# Patient Record
Sex: Male | Born: 1950 | Race: Black or African American | Hispanic: No | Marital: Single | Smoking: Never smoker
Health system: Southern US, Community
[De-identification: ages and names within clinical notes are randomized; demographics above are authoritative.]

## PROBLEM LIST (undated history)

## (undated) DIAGNOSIS — E119 Type 2 diabetes mellitus without complications: Secondary | ICD-10-CM

## (undated) DIAGNOSIS — E7800 Pure hypercholesterolemia, unspecified: Secondary | ICD-10-CM

## (undated) DIAGNOSIS — I10 Essential (primary) hypertension: Secondary | ICD-10-CM

## (undated) HISTORY — PX: PROSTATECTOMY: SHX69

---

## 2004-01-03 ENCOUNTER — Inpatient Hospital Stay (HOSPITAL_COMMUNITY): Admission: RE | Admit: 2004-01-03 | Discharge: 2004-01-06 | Payer: Self-pay | Admitting: Urology

## 2004-01-03 ENCOUNTER — Encounter (INDEPENDENT_AMBULATORY_CARE_PROVIDER_SITE_OTHER): Payer: Self-pay | Admitting: Specialist

## 2007-12-13 ENCOUNTER — Emergency Department (HOSPITAL_COMMUNITY): Admission: EM | Admit: 2007-12-13 | Discharge: 2007-12-13 | Payer: Self-pay | Admitting: Emergency Medicine

## 2010-06-14 NOTE — Op Note (Signed)
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   PROCEDURE:  Radical retropubic prostatectomy, bilateral pelvic lymph node  dissection.   ANESTHESIA:  General.   SURGEON:  Courtney Paris, M.D.   ASSISTANTVonita Moss.   BRIEF HISTORY:  This 60 year old black male is admitted with a clinical T1B  Gleason 3+3 adenocarcinoma of the prostate for radical surgery.  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  After mechanical bowel prep, he enters now  for radical surgery.   With the patient in the supine position with a rolled towel under his  sacrum, the patient was placed in a slightly flexed position.  The  suprapubic area was shaved with electric shavers and prepped and draped with  Betadine in the usual sterile fashion.  Gentamicin and Ancef was given  preoperatively.  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  The bladder was then drained.  A midline  incision was made and carried down through the subcutaneous tissue to expose  the midline fascia, and the rectus was opened in the midline.  The  retroperitoneal space was then opened.  His muscles were quite tense, and it  took quite a bit of relaxation to be able to expose the  pelvis with the  Bookwalter retractor.  First, on the patient's right side, the nodes in the  obturator fossa were removed by clipping and tying efferent lymphatics and  taking this packet from the external iliac vein down to an including the  obturator fossa back to the bifurcation of the iliac.  Care was taken to  preserve the obturator nerve and vessels.  The nodes were felt to be benign.  The endopelvic fascia was incised with the Bovie on this side.  On the  patient's left side, a similar packet was taken from the obturator fossa.  Again, at the inferior margin of the iliac vein down to the obturator nerve  back to the bifurcation of the iliac, this packet of nodes also felt benign.  The efferent lymphatics were either clipped or tied as they were  encountered.  The endopelvic fascia was then incised with the Bovie on this  side as well.  The puboprostatic ligaments were then cut under direct  vision, and a Hohenfellner clamp was then passed underneath the dorsal vein  complex with two ties with a #1 Vicryl and then  a back-tie with a 0 Vicryl  suture.  Another 0 Vicryl was placed on the bladder neck to prevent back-  bleeding, as the dorsal vein complex was then incised with the Bovie.  Using  the Stamey retractor, I could expose the urethra, which was well visible.  A  right angle clamp was passed underneath this and elevated with the umbilical  tape.  Under direct vision, the urethra was cut, leaving a nice stump under  which later sutures could be later passed.  The Foley catheter was then  brought through the anterior wall of the urethra as the posterior wall was  being cut as well as Denonvilliers fascia posteriorly.  This plane was  developed sharply and bluntly as the pedicle was then taken on either side  close to the prostate back to the level of the seminal vesicles.  This was  done either with sutures or clips.  There was a little bit of bleeding of  the neurovascular bundle  on the patient's left side, which had to be clipped  to effect hemostasis.  The midline over the seminal vesicles was then  incised, and the vas deferens were then delivered, clipped, and then  divided.  The ends of the vas were then dissected out, and a clamp with silk  suture was then passed around the tips and tied off as the seminal vesicles  were then incised.   Attention was then turned to the bladder neck, where the bladder neck was  dissected free with tonsil clamp.  The urethra was then dissected as it  coursed into the prostate and then cut under direct vision.  The Foley was  removed and used to elevate the prostate.  The prostate was then dissected  off the bladder both sharply and bluntly.  The prostate and seminal vesicles  were then removed.  Hemostasis was achieved by using a few 4-0 chromic cat  gut sutures to effect good hemostasis. The edges of the urethral mucosa on  the bladder neck was then everted with 4-0 chromic cat gut sutures  circumferentially with simple sutures.   Next, a 24 Greenwald sound was then passed per urethra, and five sutures  were placed on the urethral stump at 5, 9, 12, 3, and 5 o'clock with 2-0  Vicryl sutures.  The [place] sound was then removed, and a #20 Foley  catheter was then passed and then passed into the bladder and the balloon  inflated to 15 cc.  The corresponding urethral stitches were then placed on  the bladder neck in their anatomic positions, and when tied down, the  anastomosis seemed to be fairly water tight.  A few small clots were  evacuated from the bladder, but the irrigant was then clear.  The patient  then had a J-P drain brought out through a separate stab incision on the  patient's left side and sutured to the skin with a nylon.  The midline  fascia was closed with a running #1 PDS.  The skin was closed with clips.  Sterile dry dressings were applied.  The Foley was left to straight drainage.  Patient was taken to  the recovery room in good condition.  Sponge, needle, and instrument counts were correct on at least two  occasions.  Estimated blood loss was about 1100 cc, but no transfusions were  given, as he was stable throughout.  He was sent to the recovery room in  good condition, to later be admitted for postoperative care.  HMK/MEDQ  D:  01/03/2004  T:  01/03/2004  Job:  [id number]

## 2010-06-14 NOTE — Discharge Summary (Signed)
NAME:  Steven Barr, Steven Barr NO.:  [id number]   MEDICAL RECORD NO.:  [id number]          PATIENT TYPE:  INP   LOCATION:  0371                         FACILITY:  [facility]   PHYSICIAN:  Rozanna Boer., M.D.DATE OF BIRTH:  [birth year]   DATE OF ADMISSION:  01/03/2004  DATE OF DISCHARGE:  01/06/2004                                 DISCHARGE SUMMARY   DISCHARGE DIAGNOSES:  1.  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  2.  Elevated prostate specific antigen.  3.  Erectile dysfunction.  4.  Hypertension.   OPERATIONS/PROCEDURES:  Radical retropubic prostatectomy with bilateral  pelvic lymph node excision, January 03, 2004.   BRIEF HISTORY:  A 60 year old black male was admitted with a clinical IC  Gleason 3+3 adenocarcinoma of the prostate for radical retropubic  prostatectomy.  His PSA was 8.5 in October.  It was 3.1 in April, 2001.  Biopsy showed 5% on the left and 15% on the right of Gleason 3+3.  He does  have some impotence but decided to go ahead and do radical surgery, which  had the risks included but not limited to incontinence, impotence, deep  venous thrombosis, bleeding, pulmonary emboli, and death.  He auto-donated 2  units of blood.  His medicines include lisinopril 20 mg daily for blood  pressure.  The only previous operation was circumcision in 1995.  No  allergies.   After satisfactory preop evaluation, which included a hematocrit of 34% and  normal renal function tests with a creatinine of 1.0 and normal lytes, he  was taken up to the operating room, where he underwent a radical retropubic  prostatectomy and bilateral pelvic lymph node dissection on December 7th.  The patient had about 1100 cc of blood loss but postoperatively, his volume  was low and had to require fair amounts of fluids.  His hematocrit was  checked and was 7.8, so he was given 2 units of autologous blood in the  first two days postoperatively.  His pathology specimen  showed a PT2C, N0,  Gleason 3+4 adenocarcinoma.  The nodes were negative.  He had no capsular  extension, and the margins were negative for tumor.  Seminal vesicles and  nodes were negative.  His hematocrit remained stable at 28.5 after the blood  transfusions, and he progressed to a regular diet.  His drain was removed on  the third postop day, at which time he was discharged in satisfactory,  ambulatory condition on a regular diet.  He will come back to the office in  four days to have his skin staples removed, sent home in improved ambulatory  condition on a regular diet.      HMK/MEDQ  D:  01/17/2004  T:  01/17/2004  Job:  [id number]

## 2010-06-14 NOTE — H&P (Signed)
NAME:  Steven Barr, Steven Barr NO.:  [id number]   MEDICAL RECORD NO.:  [id number]          PATIENT TYPE:  INP   LOCATION:  NA                           FACILITY:  [facility]   PHYSICIAN:  Rozanna Boer., M.D.DATE OF BIRTH:  November 15, 1950   DATE OF ADMISSION:  DATE OF DISCHARGE:                                HISTORY & PHYSICAL   BRIEF HISTORY:  This 60 year old black male is admitted with a clinical T1B  Gleason 3 +3 adenocarcinoma of the prostate for radical retropubic  prostatectomy.  His PSA was 8.5 in October.  It was 3.1 in April 2001.  He  had 5% positive biopsies on the left and 15% on the right.  He does have  some impotence but decided to go ahead with surgery with risks including but  not limited to incontinence, impotence, deep vein thrombosis, pulmonary  emboli, bleeding, and death.  Daughter donated 2 units of blood, and  underwent a mechanical bowel prep yesterday.   PAST MEDICAL HISTORY:  1.  His medicines include Lisinopril 20 mg daily.  2.  No allergies.  3.  Only operation was a circumcision in 1996.   REVIEW OF SYSTEMS:  He is a nonsmoker.  No cardiac or pulmonary  symptomatology, no change in bowel habits, no melena, no arthritis,  breathing disorders, diabetes, or liver disease.   FAMILY HISTORY AND SOCIAL HISTORY:  He has been divorced from his second  wife about 6 years ago, has a 60 year old daughter from his first marriage.  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  His father died of a train accident at age  57.  His mother is 71, living and well.  He has two sisters and three  brothers living and well.  One sister died of an intestinal obstruction at  age 12, and one brother died of an MI at 44 years of age.  He has a brother  with diabetes and no family history of prostate cancer.   PHYSICAL EXAMINATION:  VITAL SIGNS:  His temperature is afebrile.  Blood  pressure is 149/90, pulse 56, respirations 18.  GENERAL:  He is a stocky, young black male in  no acute distress.  HEENT:  Clear.  Oropharynx is benign.  NECK:  Clear.  No inguinal, cervical, or axillary adenopathy.  CHEST:  Clear.  No murmurs.  ABDOMEN:  Soft.  Liver/spleen nonpalpable.  Bladder is nondistended.  GU:  Penis normal circumcised, adequate meatus, bilaterally descended  testes, epididymis, scrotum negative.  He has about a 35 g prostate, not  fixed or indurated.  Seminal vesicals not palpated.  EXTREMITIES:  Negative, no edema, good distal pulses.  Intact sensation to  light touch.   IMPRESSION:  1.  T1B Gleason 3 + 3 adenocarcinoma of the prostate.  2.  Elevated PSA.  3.  ED.  4.  Mild hypertension under control.   RECOMMEND:  Radical retropubic prostatectomy and bilateral pelvic lymph node  dissection as indicated.      HMK/MEDQ  D:  01/03/2004  T:  01/03/2004  Job:  [id number]   cc:   Bryan Lemma. Ehinger,  M.D.  [address] [place]  [place] [address]  Fax: [phone]

## 2010-08-02 IMAGING — CR DG KNEE COMPLETE 4+V*L*
4 series · 4 of 4 positions shown · non-contrast
Comparison: None

CLINICAL DATA: Left knee pain

LEFT KNEE - COMPLETE 4+ VIEW

[t knee ap left]
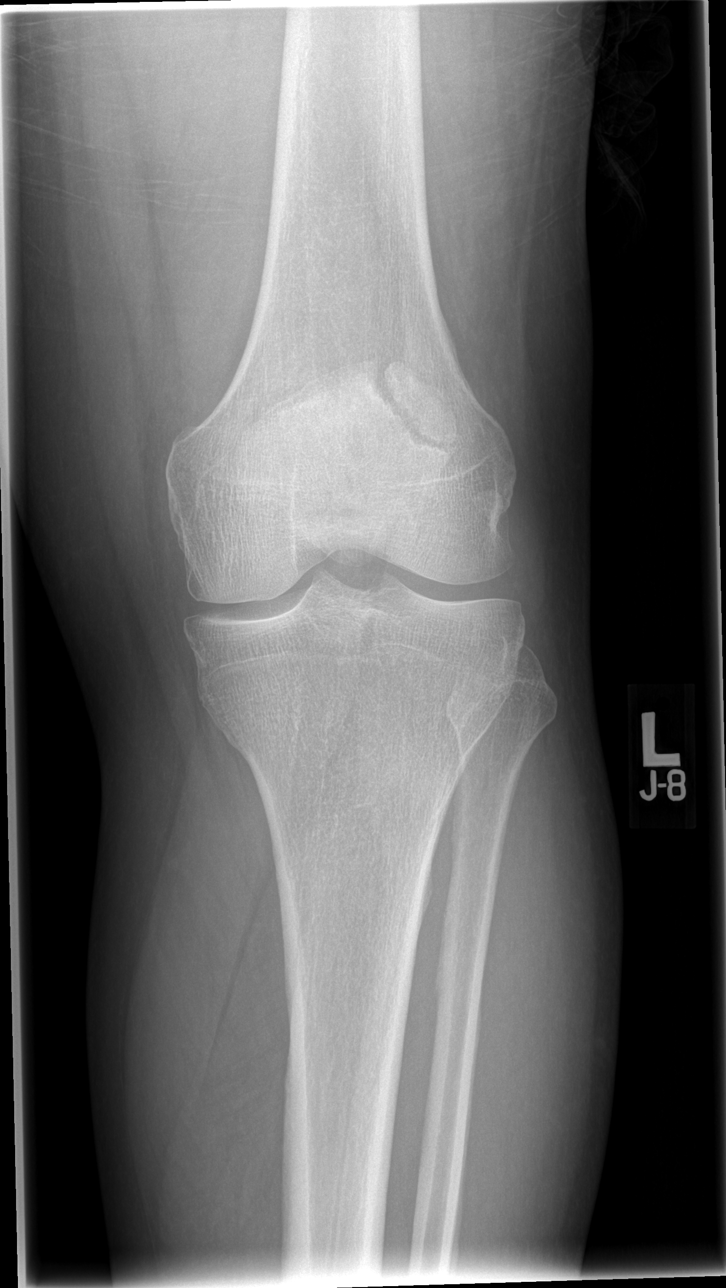

[t knee oblique left (1 of 2)]
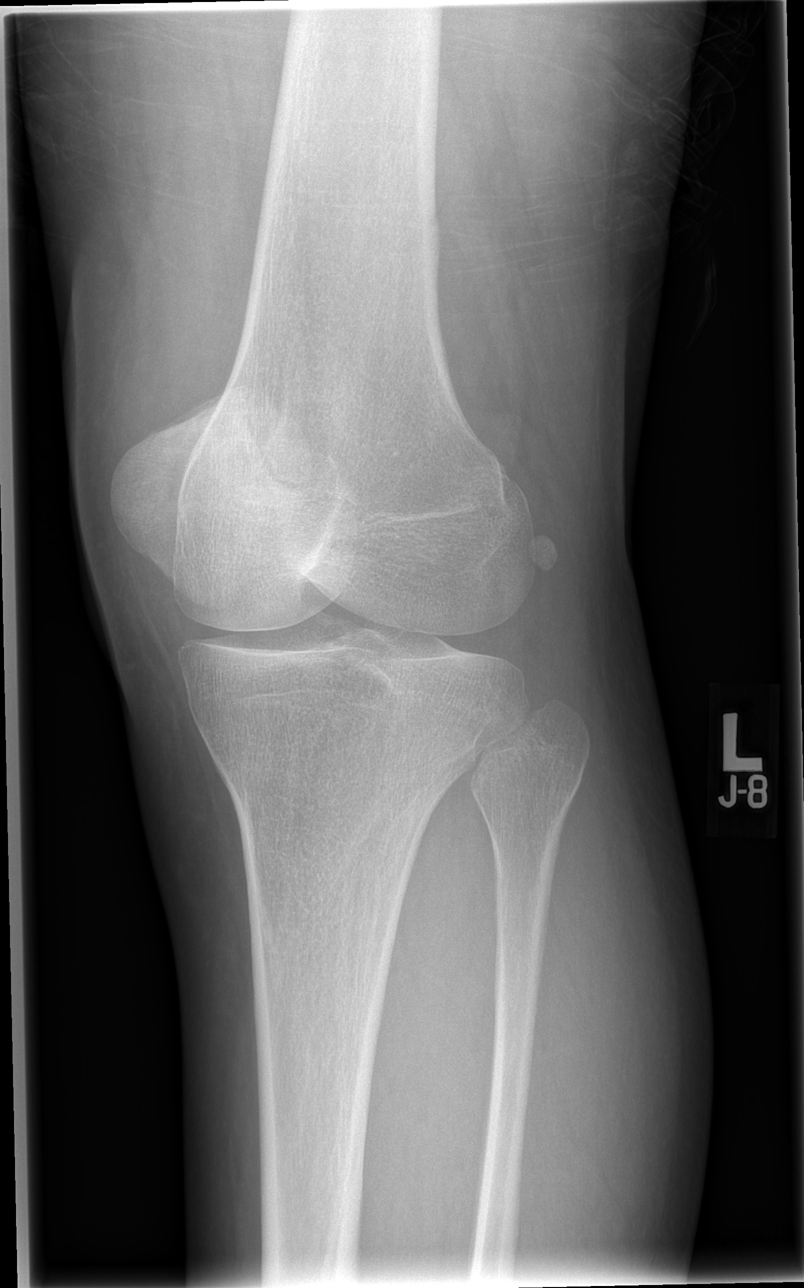

[t knee oblique left (2 of 2)]
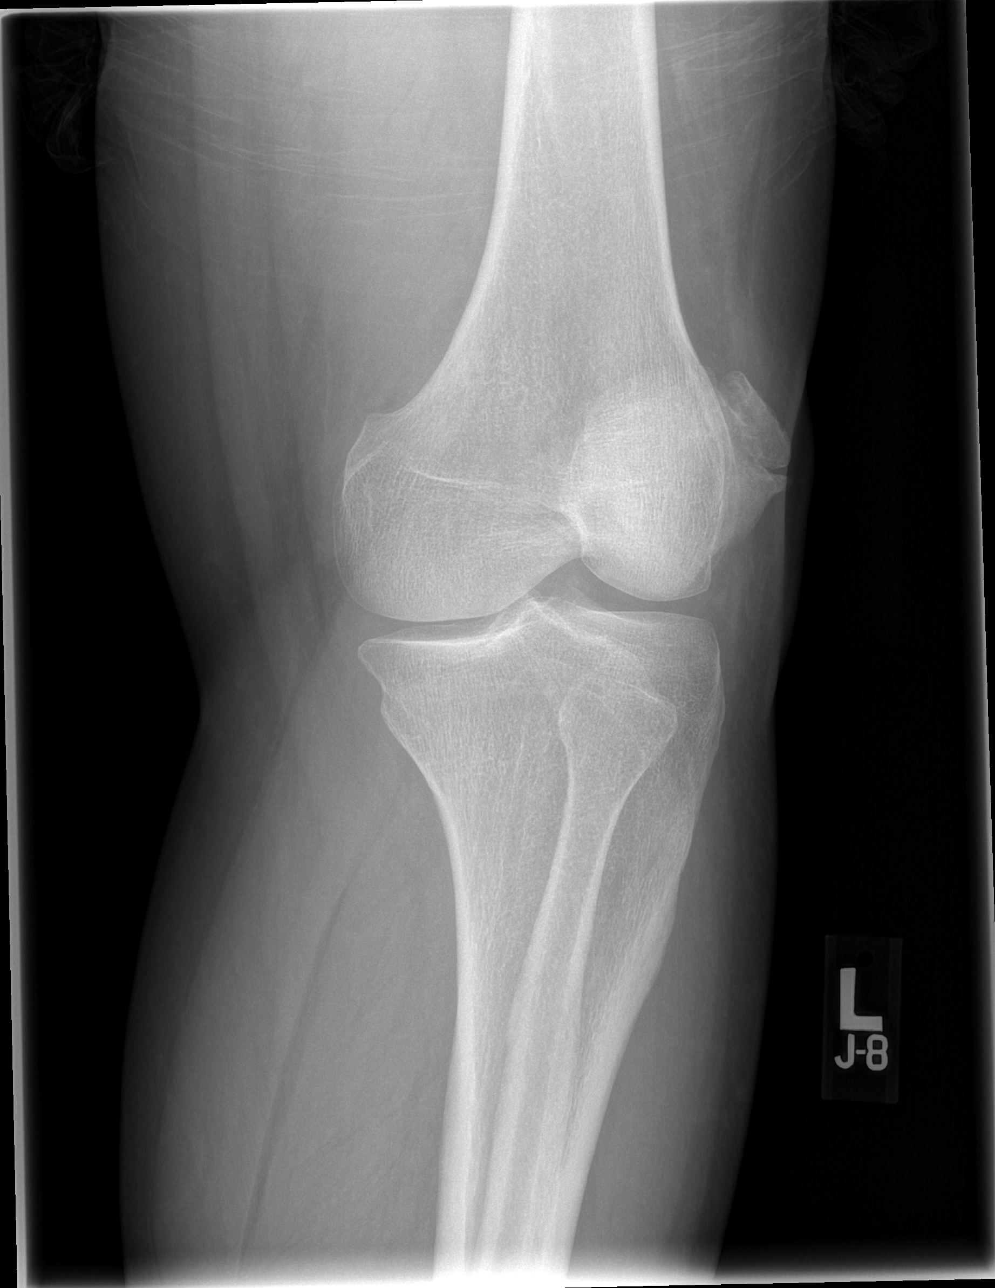

[t knee lat left]
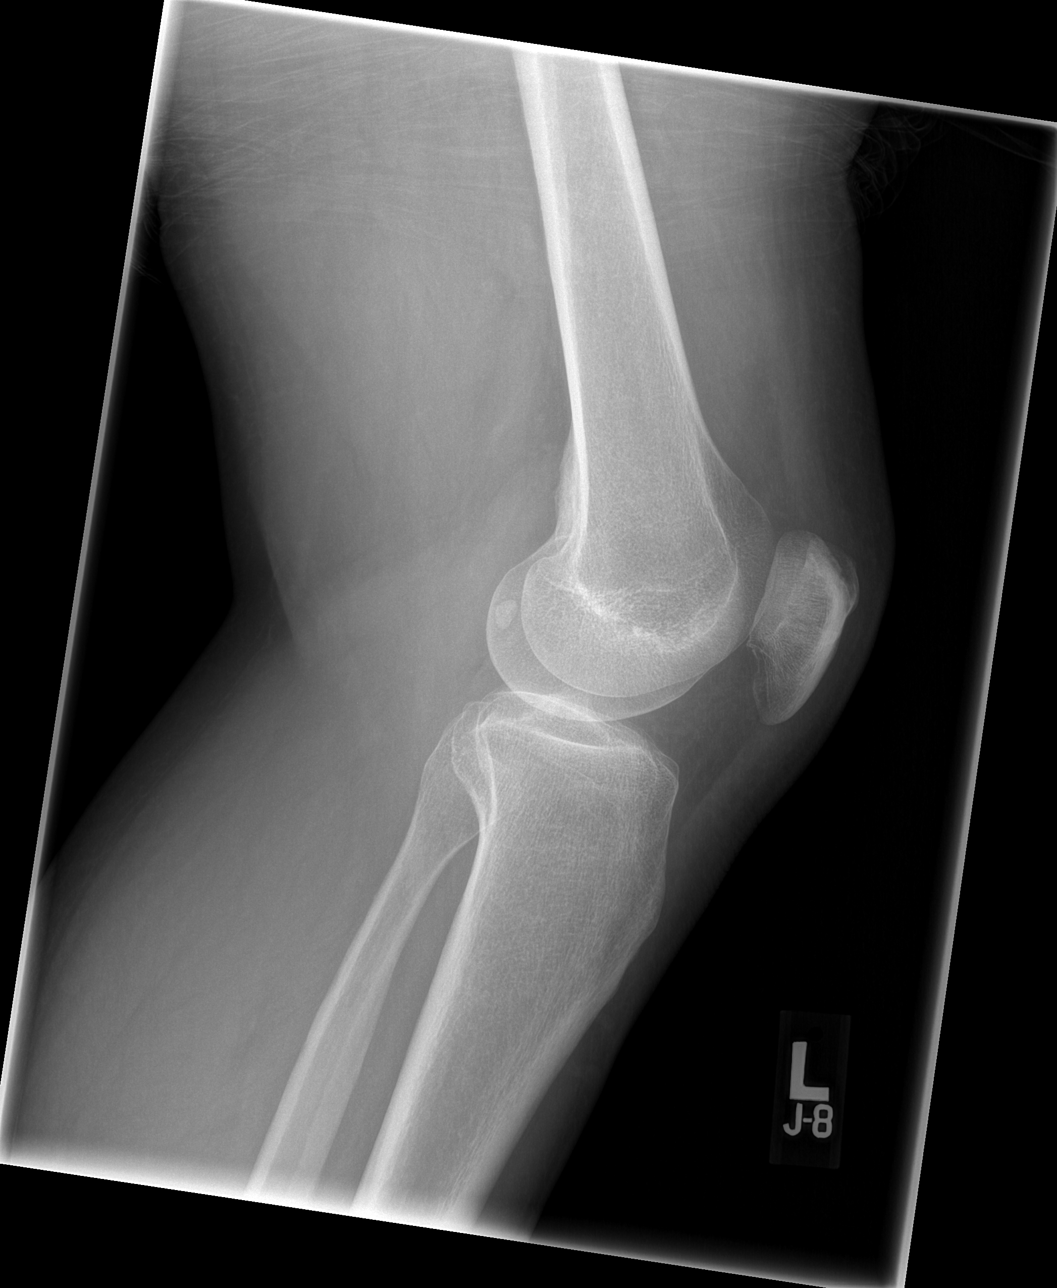

[4 of 4 positions shown; findings below may reference images not displayed]

FINDINGS: No acute fracture, dislocation or joint effusion.  No
significant degenerative changes.  There is a bipartite patella.
IMPRESSION: No acute findings.

## 2014-05-04 ENCOUNTER — Other Ambulatory Visit: Payer: Self-pay | Admitting: Neurosurgery

## 2014-05-04 DIAGNOSIS — M4722 Other spondylosis with radiculopathy, cervical region: Secondary | ICD-10-CM

## 2016-11-24 ENCOUNTER — Encounter (HOSPITAL_COMMUNITY): Payer: Self-pay

## 2016-11-24 ENCOUNTER — Emergency Department (HOSPITAL_COMMUNITY): Payer: BLUE CROSS/BLUE SHIELD

## 2016-11-24 DIAGNOSIS — R0789 Other chest pain: Secondary | ICD-10-CM | POA: Diagnosis not present

## 2016-11-24 DIAGNOSIS — E782 Mixed hyperlipidemia: Secondary | ICD-10-CM | POA: Diagnosis not present

## 2016-11-24 DIAGNOSIS — R072 Precordial pain: Secondary | ICD-10-CM | POA: Diagnosis not present

## 2016-11-24 DIAGNOSIS — Z79899 Other long term (current) drug therapy: Secondary | ICD-10-CM | POA: Insufficient documentation

## 2016-11-24 DIAGNOSIS — I10 Essential (primary) hypertension: Secondary | ICD-10-CM | POA: Diagnosis not present

## 2016-11-24 DIAGNOSIS — Z8249 Family history of ischemic heart disease and other diseases of the circulatory system: Secondary | ICD-10-CM | POA: Diagnosis not present

## 2016-11-24 DIAGNOSIS — R9431 Abnormal electrocardiogram [ECG] [EKG]: Secondary | ICD-10-CM | POA: Diagnosis not present

## 2016-11-24 DIAGNOSIS — E1169 Type 2 diabetes mellitus with other specified complication: Secondary | ICD-10-CM | POA: Diagnosis not present

## 2016-11-24 DIAGNOSIS — R079 Chest pain, unspecified: Secondary | ICD-10-CM | POA: Diagnosis not present

## 2016-11-24 LAB — BASIC METABOLIC PANEL
ANION GAP: 14 (ref 5–15)
BUN: 26 mg/dL — ABNORMAL HIGH (ref 6–20)
CO2: 20 mmol/L — ABNORMAL LOW (ref 22–32)
Calcium: 9.5 mg/dL (ref 8.9–10.3)
Chloride: 102 mmol/L (ref 101–111)
Creatinine, Ser: 1.36 mg/dL — ABNORMAL HIGH (ref 0.61–1.24)
GFR calc Af Amer: >60 mL/min (ref >60)
GFR calc non Af Amer: 53 mL/min — ABNORMAL LOW (ref >60)
Glucose, Bld: 122 mg/dL — ABNORMAL HIGH (ref 65–99)
Potassium: 3.9 mmol/L (ref 3.5–5.1)
Sodium: 136 mmol/L (ref 135–145)

## 2016-11-24 LAB — CBC
HEMATOCRIT: 39.1 % (ref 39.0–52.0)
Hemoglobin: 13.7 g/dL (ref 13.0–17.0)
MCH: 29.1 pg (ref 26.0–34.0)
MCHC: 35 g/dL (ref 30.0–36.0)
MCV: 83 fL (ref 78.0–100.0)
Platelets: 418 10*3/uL — ABNORMAL HIGH (ref 150–400)
RBC: 4.71 MIL/uL (ref 4.22–5.81)
RDW: 13.9 % (ref 11.5–15.5)
WBC: 4.1 10*3/uL (ref 4.0–10.5)

## 2016-11-24 LAB — I-STAT TROPONIN, ED: Troponin i, poc: 0 ng/mL (ref 0.00–0.08)

## 2016-11-24 NOTE — ED Triage Notes (Signed)
Per [facility], pt is coming from MD office with left-sided non-radiating dull 1/10 Chest about a week ago. Pt was given 324 mg of ASA decreasing pain to 0/10. Some SOB noted, but did not complain of it with transport. Denies N/V, diaphoresis. EKG unremarkable. Vitals per EMS: 160/90, 80 HR, 100% RA, 16 RR, 128 CBG.

## 2016-11-25 ENCOUNTER — Other Ambulatory Visit: Payer: Self-pay

## 2016-11-25 ENCOUNTER — Emergency Department (HOSPITAL_COMMUNITY)
Admission: EM | Admit: 2016-11-25 | Discharge: 2016-11-25 | Disposition: A | Discharge status: Home / Self Care | Payer: BLUE CROSS/BLUE SHIELD | Attending: Emergency Medicine | Admitting: Emergency Medicine

## 2016-11-25 DIAGNOSIS — R072 Precordial pain: Secondary | ICD-10-CM

## 2016-11-25 HISTORY — DX: Pure hypercholesterolemia, unspecified: E78.00

## 2016-11-25 HISTORY — DX: Essential (primary) hypertension: I10

## 2016-11-25 HISTORY — DX: Type 2 diabetes mellitus without complications: E11.9

## 2016-11-25 LAB — I-STAT TROPONIN, ED: Troponin i, poc: 0 ng/mL (ref 0.00–0.08)

## 2016-11-25 MED ORDER — FAMOTIDINE 20 MG PO TABS: 20.0000 mg | ORAL_TABLET | Freq: Two times a day (BID) | ORAL | 0 refills | Status: AC

## 2016-11-25 NOTE — Discharge Instructions (Signed)
Please read instructions below.  Follow up with your primary care provider or cardiology about your visit today. You would benefit from a cardiac stress test, given your risk factors for heart disease.  Return to the ER for new or worsening symptoms; including worsening chest pain, shortness of breath, pain that radiates to the arm or neck, pain or shortness of breath worsened with exertion.

## 2016-11-25 NOTE — ED Provider Notes (Signed)
[facility] EMERGENCY DEPARTMENT Provider Note   CSN: [id number] Arrival date & time: 11/24/16  1436     History   Chief Complaint Chief Complaint  Patient presents with  . Chest Pain    HPI Steven Barr is a 66 y.o. male with past medical history of diabetes, hyperlipidemia, hypertension, presenting to the ED via EMS from his PCP for 1 week of intermittent dull chest pains.  Patient states the pains have been postprandial, however have been occurring more frequently and not associated with meals.  He states it feels like gas or indigestion with some mild associated nausea minimal shortness of breath.  He denies diaphoresis or radiation of pain.  States he has been treating his symptoms with meloxicam with some relief, he also took some aspirin.  No personal cardiac history, no cardiac workup in the past.  Positive family history of brother and parent with heart disease.  Patient asymptomatic in the ED.  Denies lower extremity edema or pain, fever/chills, cough, or any other complaints today.  The history is provided by the patient.    Past Medical History:  Diagnosis Date  . Diabetes mellitus without complication (HCC)   . Hypercholesteremia   . Hypertension     There are no active problems to display for this patient.   Past Surgical History:  Procedure Laterality Date  . PROSTATECTOMY         Home Medications    Prior to Admission medications   Medication Sig Start Date End Date Taking? Authorizing Provider  famotidine (PEPCID) 20 MG tablet Take 1 tablet (20 mg total) by mouth 2 (two) times daily. 11/25/16   Creta Dorame, [place] N, PA-C    Family History No family history on file.  Social History Social History  Substance Use Topics  . Smoking status: Never Smoker  . Smokeless tobacco: Never Used  . Alcohol use Yes     Allergies   Patient has no known allergies.   Review of Systems Review of Systems  Constitutional: Negative for  chills, diaphoresis and fever.  Respiratory: Positive for shortness of breath. Negative for cough.   Cardiovascular: Positive for chest pain. Negative for palpitations and leg swelling.  Gastrointestinal: Positive for nausea. Negative for abdominal pain and vomiting.  All other systems reviewed and are negative.    Physical Exam Updated Vital Signs BP (!) 154/70 (BP Location: Right Arm)   Pulse 60   Temp 98 F (36.7 C) (Oral)   Resp 18   Ht 5\' 8"  (1.727 m)   Wt 104.3 kg (230 lb)   SpO2 100%   BMI 34.97 kg/m   Physical Exam  Constitutional: He appears well-developed and well-nourished. No distress.  HENT:  Head: Normocephalic and atraumatic.  Eyes: Conjunctivae are normal.  Cardiovascular: Normal rate, regular rhythm, normal heart sounds and intact distal pulses.  Exam reveals no gallop and no friction rub.   No murmur heard. Pulmonary/Chest: Effort normal and breath sounds normal. No respiratory distress. He has no wheezes. He has no rales.  Abdominal: Soft. Bowel sounds are normal. There is no tenderness. There is no guarding.  Musculoskeletal:  No lower extremity edema or tenderness.  Neurological: He is alert.  Skin: Skin is warm. He is not diaphoretic.  Psychiatric: He has a normal mood and affect. His behavior is normal.  Nursing note and vitals reviewed.    ED Treatments / Results  Labs (all labs ordered are listed, but only abnormal results are displayed) Labs Reviewed  BASIC METABOLIC PANEL - Abnormal; Notable for the following:       Result Value   CO2 20 (*)    Glucose, Bld 122 (*)    BUN 26 (*)    Creatinine, Ser 1.36 (*)    GFR calc non Af Amer 53 (*)    All other components within normal limits  CBC - Abnormal; Notable for the following:    Platelets 418 (*)    All other components within normal limits  I-STAT TROPONIN, ED  I-STAT TROPONIN, ED    EKG  EKG Interpretation  Date/Time:  Tuesday November 25 2016 01:39:48 EDT Ventricular Rate:   80 PR Interval:    QRS Duration: 100 QT Interval:  406 QTC Calculation: 469 R Axis:   -63 Text Interpretation:  Sinus rhythm LAD, consider left anterior fascicular block Abnormal R-wave progression, late transition Interpretation limited secondary to artifact Otherwise no significant change Confirmed by Zadie RhineWickline, Donald ([id number]) on 11/25/2016 1:46:47 AM       Radiology Dg Chest 2 View  Result Date: 11/24/2016 CLINICAL DATA:  Chest pain. EXAM: CHEST  2 VIEW COMPARISON:  Radiographs of January 05, 2004. FINDINGS: The heart size and mediastinal contours are within normal limits. Both lungs are clear. No pneumothorax or pleural effusion is noted. The visualized skeletal structures are unremarkable. IMPRESSION: No active cardiopulmonary disease. Electronically Signed   By: Lupita RaiderJames  Green Jr, M.D.   On: 11/24/2016 15:45    Procedures Procedures (including critical care time)  Medications Ordered in ED Medications - No data to display   Initial Impression / Assessment and Plan / ED Course  I have reviewed the triage vital signs and the nursing notes.  Pertinent labs & imaging results that were available during my care of the patient were reviewed by me and considered in my medical decision making (see chart for details).  Clinical Course as of Nov 25 224  Tue Nov 25, 2016  0217 Per Dr. Bebe ShaggyWickline, EKG unremarkable.  [JR]    Clinical Course User Index [JR] Creighton Longley, [place] N, PA-C   Patient is to be discharged with recommendation to follow up with PCP in regards to today's hospital visit. Suspect GERD as possible cause, given post-prandial occurrence. Will discharge with pepcid. Chest pain is not likely of cardiac or pulmonary etiology d/t presentation, VSS, no tracheal deviation, no JVD or new murmur, RRR, breath sounds equal bilaterally, EKG without acute abnormalities, negative troponin x2, and negative CXR. Heart score 4; discussed overnight observation vs discharge and cardiology  follow up, shared decision making was made by Dr. Bebe ShaggyWickline and pt, pt preferring discharge with outpatient follow up. Feel this is appropriate. Pt has been advised to return to the ED if CP becomes exertional, associated with diaphoresis or nausea, radiates to left jaw/arm, worsens or becomes concerning in any way. Pt appears reliable for follow up and is agreeable to discharge.   Case has been discussed with and seen by Dr. Bebe ShaggyWickline who agrees with the above plan to discharge.   Discussed results, findings, treatment and follow up. Patient advised of return precautions. Patient verbalized understanding and agreed with plan.  Final Clinical Impressions(s) / ED Diagnoses   Final diagnoses:  Precordial chest pain    New Prescriptions New Prescriptions   FAMOTIDINE (PEPCID) 20 MG TABLET    Take 1 tablet (20 mg total) by mouth 2 (two) times daily.     Yaeko Fazekas, [place] N, PA-C 11/25/16 Leeanne [id number]    Zadie RhineWickline, Donald, MD 11/25/16 [phone]

## 2016-11-25 NOTE — ED Provider Notes (Signed)
Patient seen/examined in the Emergency Department in conjunction with Midlevel Provider  Patient reports chest pain/pressure, thought it was heartburn.  Now CP free.  Sent by PCP Exam : awake/alert, no distress, resting comfortably Plan: HEART score 4 I discussed option of admission vs. Discharge home with close PCP followup  Pt and family decide to be discharged However will check repeat troponin prior to d/c home  Advised to stop meloxicam   Zadie RhineWickline, Maijor Hornig, MD 11/25/16 0202

## 2016-12-01 DIAGNOSIS — Z23 Encounter for immunization: Secondary | ICD-10-CM | POA: Diagnosis not present

## 2016-12-01 DIAGNOSIS — Z1211 Encounter for screening for malignant neoplasm of colon: Secondary | ICD-10-CM | POA: Diagnosis not present

## 2016-12-01 DIAGNOSIS — E1169 Type 2 diabetes mellitus with other specified complication: Secondary | ICD-10-CM | POA: Diagnosis not present

## 2016-12-01 DIAGNOSIS — I10 Essential (primary) hypertension: Secondary | ICD-10-CM | POA: Diagnosis not present

## 2016-12-01 DIAGNOSIS — E119 Type 2 diabetes mellitus without complications: Secondary | ICD-10-CM | POA: Diagnosis not present

## 2016-12-01 DIAGNOSIS — E782 Mixed hyperlipidemia: Secondary | ICD-10-CM | POA: Diagnosis not present

## 2017-05-11 DIAGNOSIS — H40013 Open angle with borderline findings, low risk, bilateral: Secondary | ICD-10-CM | POA: Diagnosis not present

## 2017-05-11 DIAGNOSIS — E119 Type 2 diabetes mellitus without complications: Secondary | ICD-10-CM | POA: Diagnosis not present

## 2017-05-11 DIAGNOSIS — H25813 Combined forms of age-related cataract, bilateral: Secondary | ICD-10-CM | POA: Diagnosis not present

## 2017-06-01 DIAGNOSIS — E1122 Type 2 diabetes mellitus with diabetic chronic kidney disease: Secondary | ICD-10-CM | POA: Diagnosis not present

## 2017-06-01 DIAGNOSIS — I10 Essential (primary) hypertension: Secondary | ICD-10-CM | POA: Diagnosis not present

## 2017-06-01 DIAGNOSIS — N183 Chronic kidney disease, stage 3 (moderate): Secondary | ICD-10-CM | POA: Diagnosis not present

## 2017-06-01 DIAGNOSIS — E1159 Type 2 diabetes mellitus with other circulatory complications: Secondary | ICD-10-CM | POA: Diagnosis not present

## 2017-06-01 DIAGNOSIS — Z1211 Encounter for screening for malignant neoplasm of colon: Secondary | ICD-10-CM | POA: Diagnosis not present

## 2017-08-06 DIAGNOSIS — M1712 Unilateral primary osteoarthritis, left knee: Secondary | ICD-10-CM | POA: Diagnosis not present

## 2017-09-01 DIAGNOSIS — E669 Obesity, unspecified: Secondary | ICD-10-CM | POA: Diagnosis not present

## 2017-09-01 DIAGNOSIS — Z1211 Encounter for screening for malignant neoplasm of colon: Secondary | ICD-10-CM | POA: Diagnosis not present

## 2017-09-01 DIAGNOSIS — K219 Gastro-esophageal reflux disease without esophagitis: Secondary | ICD-10-CM | POA: Diagnosis not present

## 2017-09-04 DIAGNOSIS — M1712 Unilateral primary osteoarthritis, left knee: Secondary | ICD-10-CM | POA: Diagnosis not present

## 2017-09-11 DIAGNOSIS — M1712 Unilateral primary osteoarthritis, left knee: Secondary | ICD-10-CM | POA: Diagnosis not present

## 2017-09-17 DIAGNOSIS — N183 Chronic kidney disease, stage 3 (moderate): Secondary | ICD-10-CM | POA: Diagnosis not present

## 2017-09-17 DIAGNOSIS — I10 Essential (primary) hypertension: Secondary | ICD-10-CM | POA: Diagnosis not present

## 2017-09-17 DIAGNOSIS — Z23 Encounter for immunization: Secondary | ICD-10-CM | POA: Diagnosis not present

## 2017-09-17 DIAGNOSIS — Z8546 Personal history of malignant neoplasm of prostate: Secondary | ICD-10-CM | POA: Diagnosis not present

## 2017-09-17 DIAGNOSIS — M1712 Unilateral primary osteoarthritis, left knee: Secondary | ICD-10-CM | POA: Diagnosis not present

## 2017-09-17 DIAGNOSIS — Z1159 Encounter for screening for other viral diseases: Secondary | ICD-10-CM | POA: Diagnosis not present

## 2017-09-17 DIAGNOSIS — E1159 Type 2 diabetes mellitus with other circulatory complications: Secondary | ICD-10-CM | POA: Diagnosis not present

## 2017-09-17 DIAGNOSIS — E1169 Type 2 diabetes mellitus with other specified complication: Secondary | ICD-10-CM | POA: Diagnosis not present

## 2017-09-17 DIAGNOSIS — E1122 Type 2 diabetes mellitus with diabetic chronic kidney disease: Secondary | ICD-10-CM | POA: Diagnosis not present

## 2017-10-09 DIAGNOSIS — Z1211 Encounter for screening for malignant neoplasm of colon: Secondary | ICD-10-CM | POA: Diagnosis not present

## 2017-10-09 DIAGNOSIS — K5730 Diverticulosis of large intestine without perforation or abscess without bleeding: Secondary | ICD-10-CM | POA: Diagnosis not present

## 2017-10-22 DIAGNOSIS — I10 Essential (primary) hypertension: Secondary | ICD-10-CM | POA: Diagnosis not present

## 2017-10-22 DIAGNOSIS — E782 Mixed hyperlipidemia: Secondary | ICD-10-CM | POA: Diagnosis not present

## 2017-10-22 DIAGNOSIS — R0789 Other chest pain: Secondary | ICD-10-CM | POA: Diagnosis not present

## 2017-10-22 DIAGNOSIS — E1165 Type 2 diabetes mellitus with hyperglycemia: Secondary | ICD-10-CM | POA: Diagnosis not present

## 2017-10-30 DIAGNOSIS — I10 Essential (primary) hypertension: Secondary | ICD-10-CM | POA: Diagnosis not present

## 2017-10-30 DIAGNOSIS — R9431 Abnormal electrocardiogram [ECG] [EKG]: Secondary | ICD-10-CM | POA: Diagnosis not present

## 2017-10-30 DIAGNOSIS — R0789 Other chest pain: Secondary | ICD-10-CM | POA: Diagnosis not present

## 2017-10-30 DIAGNOSIS — E119 Type 2 diabetes mellitus without complications: Secondary | ICD-10-CM | POA: Diagnosis not present

## 2017-11-02 DIAGNOSIS — R9431 Abnormal electrocardiogram [ECG] [EKG]: Secondary | ICD-10-CM | POA: Diagnosis not present

## 2017-11-02 DIAGNOSIS — I10 Essential (primary) hypertension: Secondary | ICD-10-CM | POA: Diagnosis not present

## 2017-11-02 DIAGNOSIS — R0789 Other chest pain: Secondary | ICD-10-CM | POA: Diagnosis not present

## 2017-12-09 DIAGNOSIS — R0789 Other chest pain: Secondary | ICD-10-CM | POA: Diagnosis not present

## 2017-12-09 DIAGNOSIS — E782 Mixed hyperlipidemia: Secondary | ICD-10-CM | POA: Diagnosis not present

## 2017-12-09 DIAGNOSIS — E1165 Type 2 diabetes mellitus with hyperglycemia: Secondary | ICD-10-CM | POA: Diagnosis not present

## 2017-12-09 DIAGNOSIS — I10 Essential (primary) hypertension: Secondary | ICD-10-CM | POA: Diagnosis not present

## 2018-04-02 ENCOUNTER — Other Ambulatory Visit: Payer: Self-pay | Admitting: Cardiology

## 2018-08-18 DIAGNOSIS — E782 Mixed hyperlipidemia: Secondary | ICD-10-CM | POA: Diagnosis not present

## 2018-08-18 DIAGNOSIS — N183 Chronic kidney disease, stage 3 (moderate): Secondary | ICD-10-CM | POA: Diagnosis not present

## 2018-08-18 DIAGNOSIS — I10 Essential (primary) hypertension: Secondary | ICD-10-CM | POA: Diagnosis not present

## 2018-08-18 DIAGNOSIS — E1169 Type 2 diabetes mellitus with other specified complication: Secondary | ICD-10-CM | POA: Diagnosis not present

## 2018-08-18 DIAGNOSIS — Z8546 Personal history of malignant neoplasm of prostate: Secondary | ICD-10-CM | POA: Diagnosis not present

## 2018-08-18 DIAGNOSIS — E1122 Type 2 diabetes mellitus with diabetic chronic kidney disease: Secondary | ICD-10-CM | POA: Diagnosis not present

## 2018-08-18 DIAGNOSIS — E1159 Type 2 diabetes mellitus with other circulatory complications: Secondary | ICD-10-CM | POA: Diagnosis not present

## 2019-07-15 IMAGING — CR DG CHEST 2V
2 series · 2 of 2 positions shown · non-contrast
Comparison: Radiographs January 05, 2004.

CLINICAL DATA: Chest pain.

EXAM:
CHEST  2 VIEW

[chest pa]
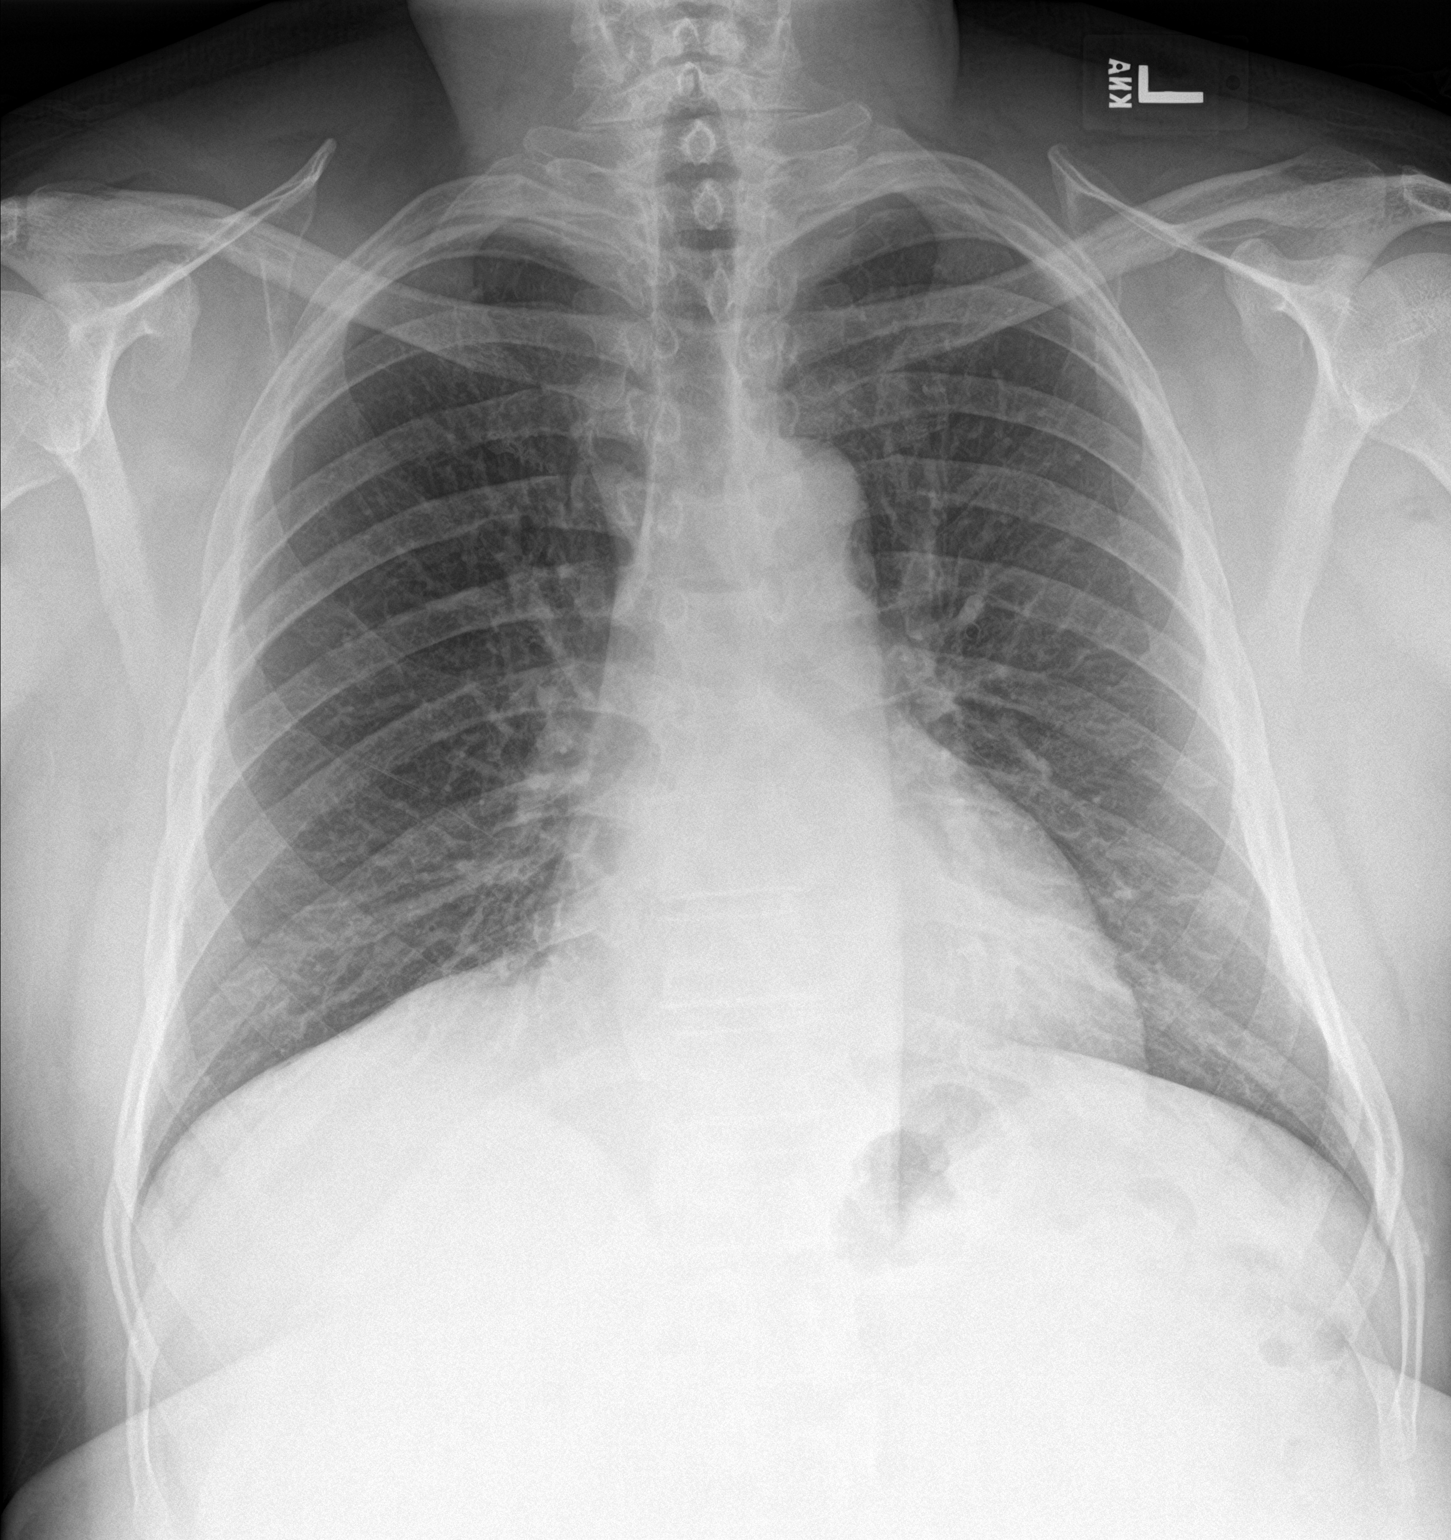

[chest lat]
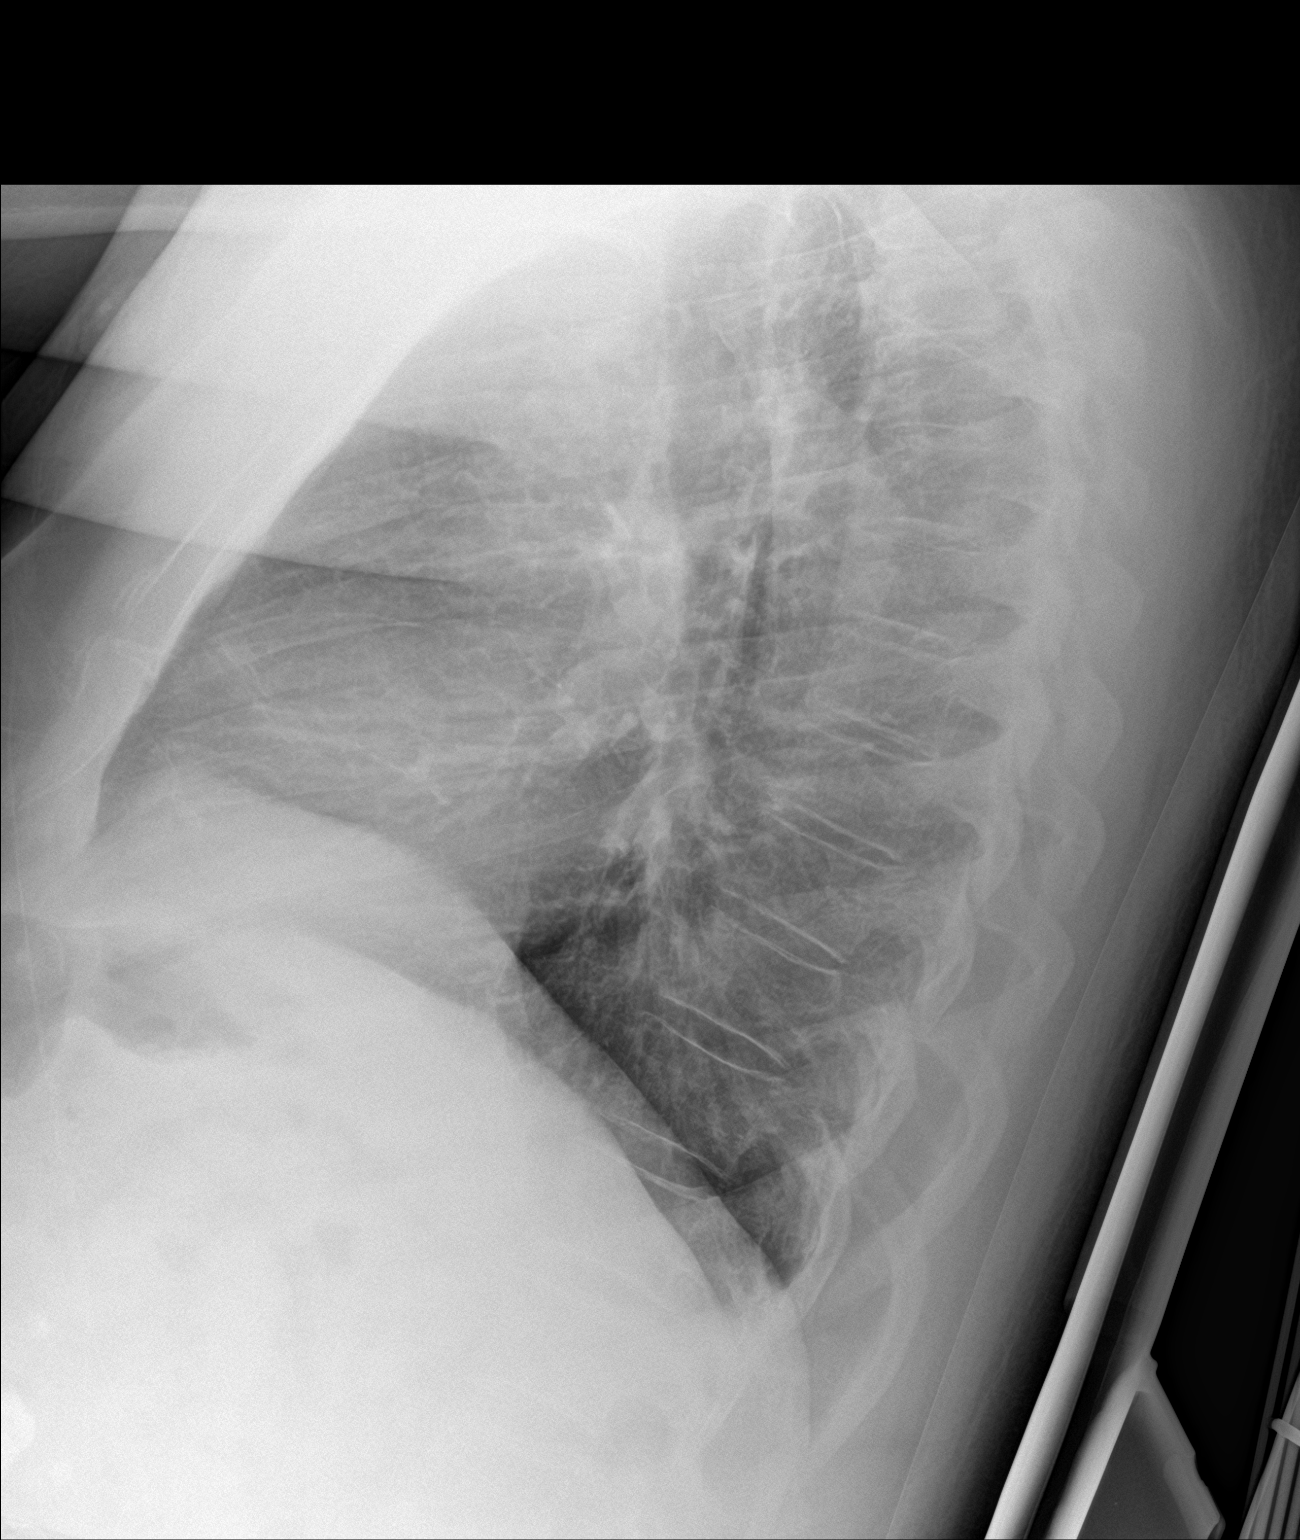

[2 of 2 positions shown; findings below may reference images not displayed]

FINDINGS: The heart size and mediastinal contours are within normal limits.
Both lungs are clear. No pneumothorax or pleural effusion is noted.
The visualized skeletal structures are unremarkable.
IMPRESSION: No active cardiopulmonary disease.

## 2023-07-06 ENCOUNTER — Ambulatory Visit (INDEPENDENT_AMBULATORY_CARE_PROVIDER_SITE_OTHER): Payer: Medicare (Managed Care)

## 2023-07-06 ENCOUNTER — Ambulatory Visit: Payer: Medicare (Managed Care) | Admitting: Podiatry

## 2023-07-06 ENCOUNTER — Encounter: Payer: Self-pay | Admitting: Podiatry

## 2023-07-06 DIAGNOSIS — M25571 Pain in right ankle and joints of right foot: Secondary | ICD-10-CM | POA: Diagnosis not present

## 2023-07-06 DIAGNOSIS — M778 Other enthesopathies, not elsewhere classified: Secondary | ICD-10-CM

## 2023-07-06 MED ORDER — TRIAMCINOLONE ACETONIDE 10 MG/ML IJ SUSP
10.0000 mg | Freq: Once | INTRAMUSCULAR | Status: AC
  Administered 2023-07-06: 10 mg

## 2023-07-06 NOTE — Progress Notes (Signed)
 Patient presents with complaint of pain and swelling on the dorsal lateral aspect of the right foot.  Does not recall any injury.  Did try some new orthotics from the [place] and this seemed to aggravate it.  Does not recall any injury to it has not noticed any redness or ecchymosis   Physical exam:  General appearance: Pleasant, and in no acute distress. AOx3.  Vascular: Pedal pulses: DP 2/4 bilaterally, PT 1/4 bilaterally.  Mild edema lower legs bilaterally. Capillary fill time immediate.  Local edema over the fourth fifth met cuboid joint right.  Neurological: Light touch intact feet bilaterally.  Normal Achilles reflex bilaterally.  No clonus or spasticity noted.  Negative Tinel's sign cutaneous nerves dorsal foot right.  Dermatologic:   Skin normal temperature bilaterally.  Skin normal color, tone, and texture bilaterally.   Musculoskeletal: No crepitation noted.  Tenderness with palpation at the fourth fifth met cuboid joint as well as range of motion.  Some tenderness along the plantar arch of the foot.  Radiographs: 3 views foot right: Normal joint space at Lisfranc's joint.  No assenting fractures or dislocations.  Normal bone density.  No subcu no subcutaneous emphysema noted.  Status post distal metatarsal osteotomy first metatarsal  Diagnosis: 1.  Arthralgia fourth fifth met cuboid joint right. 2.  Capsulitis right foot.  Plan: -Discussed  pain around the fourth fifth met cuboid joint right.  Will recommend an injection today.  Recommended RICE.  He also has a pneumatic cast recommend wear this for the next week to let it calm down - Injected 3cc 2:1 mixture 0.5 cc Marcaine:Kenolog 10mg /26ml at fourth fifth met cuboid joint right.    Return 1 week follow-up injection right

## 2023-07-13 ENCOUNTER — Encounter: Payer: Self-pay | Admitting: Podiatry

## 2023-07-13 ENCOUNTER — Ambulatory Visit: Payer: Medicare (Managed Care) | Admitting: Podiatry

## 2023-07-13 DIAGNOSIS — M25571 Pain in right ankle and joints of right foot: Secondary | ICD-10-CM

## 2023-07-13 NOTE — Progress Notes (Signed)
 Patient presents injection for fifth met cuboid joint right foot.  Doing much better about the 85 to 90% better still little bit aching at times.  Much better than it was had this any redness or swelling.   Physical exam:  General appearance: Pleasant, and in no acute distress. AOx3.  Vascular: Pedal pulses: DP 2/4 bilaterally, PT 2/4 bilaterally.  Mild edema lower legs bilaterally. Capillary fill time immediate.  Neurological: Light touch intact feet bilaterally.  Normal Achilles reflex bilaterally.    Dermatologic:   Skin normal temperature bilaterally.  Skin normal color, tone, and texture bilaterally.   Musculoskeletal: Slight soreness of dorsal fourth fifth met cuboid joint right.  No tenderness with range of motion of the fourth fifth met cuboid joint right.  No tenderness along the peroneal tendons.    Diagnosis: 1.  Arthralgia of cuboid joint right-improved  Plan: -Established office visit level 3 for evaluation and management. - Recommended icing 20 minutes an hour as needed.  Voltaren gel as needed.  Discussed proper shoes.  He can take OTC NSAIDs as needed   Return as needed
